# Patient Record
Sex: Female | Born: 1960 | Race: White | Hispanic: No | Smoking: Current every day smoker
Health system: Southern US, Community
[De-identification: ages and names within clinical notes are randomized; demographics above are authoritative.]

## PROBLEM LIST (undated history)

## (undated) HISTORY — PX: ECTOPIC PREGNANCY SURGERY: SHX613

---

## 2010-12-02 ENCOUNTER — Emergency Department (HOSPITAL_COMMUNITY)
Admission: EM | Admit: 2010-12-02 | Discharge: 2010-12-02 | Disposition: A | Discharge status: Home / Self Care | Payer: Self-pay | Attending: Emergency Medicine | Admitting: Emergency Medicine

## 2010-12-02 ENCOUNTER — Encounter: Payer: Self-pay | Admitting: Emergency Medicine

## 2010-12-02 DIAGNOSIS — K089 Disorder of teeth and supporting structures, unspecified: Secondary | ICD-10-CM | POA: Insufficient documentation

## 2010-12-02 DIAGNOSIS — K047 Periapical abscess without sinus: Secondary | ICD-10-CM | POA: Insufficient documentation

## 2010-12-02 MED ORDER — HYDROCODONE-ACETAMINOPHEN 5-325 MG PO TABS: 1.0000 | ORAL_TABLET | ORAL | Status: AC | PRN

## 2010-12-02 MED ORDER — AMOXICILLIN 500 MG PO CAPS: 500.0000 mg | ORAL_CAPSULE | Freq: Three times a day (TID) | ORAL | Status: AC

## 2010-12-02 NOTE — ED Notes (Signed)
Patient with no complaints at this time. Respirations even and unlabored. Skin warm/dry. Discharge instructions reviewed with patient at this time. Patient given opportunity to voice concerns/ask questions. Patient discharged at this time and left Emergency Department with steady gait.   

## 2010-12-02 NOTE — ED Notes (Signed)
Patient with c/o left lower jaw/dental pain for approximately one week. Swelling noted to left lower jaw. Patient reports fever at home.

## 2010-12-04 NOTE — ED Provider Notes (Signed)
History     CSN: [id number] Arrival date & time: 12/02/2010  3:34 PM   First MD Initiated Contact with Patient 12/02/10 1548      Chief Complaint  Patient presents with  . Dental Pain    (Consider location/radiation/quality/duration/timing/severity/associated sxs/prior treatment) Patient is a 50 y.o. female presenting with tooth pain. The history is provided by the patient.  Dental PainThe primary symptoms include mouth pain. Primary symptoms do not include headaches, fever, shortness of breath or sore throat. The symptoms began 5 to 7 days ago. The symptoms are worsening. The symptoms are new. The symptoms occur constantly.  Additional symptoms include: gum swelling, gum tenderness, jaw pain, facial swelling and ear pain. Additional symptoms do not include: trismus, trouble swallowing and taste disturbance.    Past Medical History  Diagnosis Date  . Ectopic pregnancy     Past Surgical History  Procedure Date  . Ectopic pregnancy surgery     No family history on file.  History  Substance Use Topics  . Smoking status: Current Everyday Smoker -- 0.5 packs/day    Types: Cigarettes  . Smokeless tobacco: Not on file  . Alcohol Use: No    OB History    Grav Para Term Preterm Abortions TAB SAB Ect Mult Living                  Review of Systems  Constitutional: Negative for fever.  HENT: Positive for ear pain, facial swelling and dental problem. Negative for congestion, sore throat, trouble swallowing and neck pain.   Eyes: Negative.   Respiratory: Negative for chest tightness and shortness of breath.   Cardiovascular: Negative for chest pain.  Gastrointestinal: Negative for nausea and abdominal pain.  Genitourinary: Negative.   Musculoskeletal: Negative for joint swelling and arthralgias.  Skin: Negative.  Negative for rash and wound.  Neurological: Negative for dizziness, weakness, light-headedness, numbness and headaches.  Hematological: Negative.     Psychiatric/Behavioral: Negative.     Allergies  Review of patient's allergies indicates no known allergies.  Home Medications   Current Outpatient Rx  Name Route Sig Dispense Refill  . IBUPROFEN 200 MG PO TABS Oral Take 400-600 mg by mouth 4 (four) times daily as needed. For pain     . AMOXICILLIN 500 MG PO CAPS Oral Take 1 capsule (500 mg total) by mouth 3 (three) times daily. 21 capsule 0  . HYDROCODONE-ACETAMINOPHEN 5-325 MG PO TABS Oral Take 1 tablet by mouth every 4 (four) hours as needed for pain. 15 tablet 0    BP 128/68  Pulse 96  Temp(Src) 99.4 F (37.4 C) (Oral)  Resp 18  Ht 5\' 6"  (1.676 m)  Wt 220 lb (99.791 kg)  BMI 35.51 kg/m2  SpO2 96%  Physical Exam  Constitutional: She is oriented to person, place, and time. She appears well-developed and well-nourished. No distress.  HENT:  Head: Normocephalic and atraumatic.  Right Ear: Tympanic membrane and external ear normal.  Left Ear: Tympanic membrane and external ear normal.  Mouth/Throat: Oropharynx is clear and moist and mucous membranes are normal. No oral lesions. Dental abscesses present.    Eyes: Conjunctivae are normal.  Neck: Normal range of motion. Neck supple.  Cardiovascular: Normal rate and normal heart sounds.   Pulmonary/Chest: Effort normal.  Abdominal: She exhibits no distension.  Musculoskeletal: Normal range of motion.  Lymphadenopathy:    She has no cervical adenopathy.  Neurological: She is alert and oriented to person, place, and time.  Skin: Skin is  warm and dry. No erythema.  Psychiatric: She has a normal mood and affect.    ED Course  Procedures (including critical care time)  Labs Reviewed - No data to display No results found.   1. Dental abscess       MDM  Amoxil,  Hydrocodone,  Dental referral list given.        Candis Musa, PA 12/04/10 [phone]

## 2010-12-04 NOTE — ED Provider Notes (Signed)
Medical screening examination/treatment/procedure(s) were performed by non-physician practitioner and as supervising physician I was immediately available for consultation/collaboration.   Laray Anger, DO 12/04/10 [phone]

## 2015-04-20 ENCOUNTER — Encounter (HOSPITAL_COMMUNITY): Payer: Self-pay | Admitting: Emergency Medicine

## 2015-04-20 ENCOUNTER — Emergency Department (HOSPITAL_COMMUNITY): Payer: Self-pay

## 2015-04-20 ENCOUNTER — Emergency Department (HOSPITAL_COMMUNITY)
Admission: EM | Admit: 2015-04-20 | Discharge: 2015-04-20 | Disposition: A | Discharge status: Home / Self Care | Payer: Self-pay | Attending: Emergency Medicine | Admitting: Emergency Medicine

## 2015-04-20 DIAGNOSIS — F17210 Nicotine dependence, cigarettes, uncomplicated: Secondary | ICD-10-CM | POA: Insufficient documentation

## 2015-04-20 DIAGNOSIS — W010XXA Fall on same level from slipping, tripping and stumbling without subsequent striking against object, initial encounter: Secondary | ICD-10-CM | POA: Insufficient documentation

## 2015-04-20 DIAGNOSIS — Z79899 Other long term (current) drug therapy: Secondary | ICD-10-CM | POA: Insufficient documentation

## 2015-04-20 DIAGNOSIS — M25562 Pain in left knee: Secondary | ICD-10-CM | POA: Insufficient documentation

## 2015-04-20 DIAGNOSIS — W19XXXA Unspecified fall, initial encounter: Secondary | ICD-10-CM

## 2015-04-20 MED ORDER — HYDROCODONE-ACETAMINOPHEN 5-325 MG PO TABS: ORAL_TABLET | ORAL | Status: DC

## 2015-04-20 MED ORDER — METHOCARBAMOL 500 MG PO TABS: 1000.0000 mg | ORAL_TABLET | Freq: Four times a day (QID) | ORAL | Status: DC | PRN

## 2015-04-20 NOTE — Discharge Instructions (Signed)
Take the prescriptions as directed.  Apply moist heat or ice to the area(s) of discomfort, for 15 minutes at a time, several times per day for the next few days.  Do not fall asleep on a heating or ice pack. Wear the knee immobilizer and use the crutches until you are seen in follow up.  Call the Orthopedic doctor today to schedule a follow up appointment this week.  Return to the Emergency Department immediately if worsening.

## 2015-04-20 NOTE — ED Notes (Signed)
PT states she was walking outside and slipped on some water and landed on her left knee and having left groin pain since incident.

## 2015-04-20 NOTE — ED Provider Notes (Signed)
CSN: [id number]     Arrival date & time 04/20/15  9562 History   First MD Initiated Contact with Patient 04/20/15 0957     Chief Complaint  Patient presents with  . Knee Injury      HPI  Pt was seen at 1005. Per pt, c/o sudden onset and resolution of one episode of slip and fall that occurred yesterday. Pt states she slipped in water and "did a split with my legs" landing on her left knee. Pain continues today. Denies focal motor weakness, no tingling/numbness in extremities, no fevers, no rash, no back pain, no abd pain.   Past Medical History  Diagnosis Date  . Ectopic pregnancy    Past Surgical History  Procedure Laterality Date  . Ectopic pregnancy surgery      Social History  Substance Use Topics  . Smoking status: Current Every Day Smoker -- 0.50 packs/day    Types: Cigarettes  . Smokeless tobacco: None  . Alcohol Use: No   OB History    Gravida Para Term Preterm AB TAB SAB Ectopic Multiple Living   Review of Systems ROS: Statement: All systems negative except as marked or noted in the HPI; Constitutional: Negative for fever and chills. ; ; Eyes: Negative for eye pain, redness and discharge. ; ; ENMT: Negative for ear pain, hoarseness, nasal congestion, sinus pressure and sore throat. ; ; Cardiovascular: Negative for chest pain, palpitations, diaphoresis, dyspnea and peripheral edema. ; ; Respiratory: Negative for cough, wheezing and stridor. ; ; Gastrointestinal: Negative for nausea, vomiting, diarrhea, abdominal pain, blood in stool, hematemesis, jaundice and rectal bleeding. . ; ; Genitourinary: Negative for dysuria, flank pain and hematuria. ; ; Musculoskeletal: +knee pain, hip pain. Negative for back pain and neck pain. Negative for swelling and deformity.; ; Skin: Negative for pruritus, rash, abrasions, blisters, bruising and skin lesion.; ; Neuro: Negative for headache, lightheadedness and neck stiffness. Negative for weakness, altered level of  consciousness , altered mental status, extremity weakness, paresthesias, involuntary movement, seizure and syncope.      Allergies  Review of patient's allergies indicates no known allergies.  Home Medications   Prior to Admission medications   Medication Sig Start Date End Date Taking? Authorizing Provider  ibuprofen (ADVIL,MOTRIN) 200 MG tablet Take 400-600 mg by mouth 4 (four) times daily as needed. For pain     Historical Provider, MD   BP 117/67 mmHg  Pulse 76  Temp(Src) 98.1 F (36.7 C) (Oral)  Resp 16  Ht  (1.676 m)  Wt 210 lb (95.255 kg)  BMI 33.91 kg/m2  SpO2 99% Physical Exam 1010: Physical examination:  Nursing notes reviewed; Vital signs and O2 SAT reviewed;  Constitutional: Well developed, Well nourished, Well hydrated, In no acute distress; Head:  Normocephalic, atraumatic; Eyes: EOMI, PERRL, No scleral icterus; ENMT: Mouth and pharynx normal, Mucous membranes moist; Neck: Supple, Full range of motion, No lymphadenopathy; Cardiovascular: Regular rate and rhythm, No murmur, rub, or gallop; Respiratory: Breath sounds clear & equal bilaterally, No rales, rhonchi, wheezes.  Speaking full sentences with ease, Normal respiratory effort/excursion; Chest: Nontender, Movement normal; Abdomen: Soft, Nontender, Nondistended, Normal bowel sounds; Genitourinary: No CVA tenderness; Spine:  No midline CS, TS, LS tenderness.;; Extremities: Pulses normal, No deformity. NT left hip/ankle/foot. +FROM left knee, including able to lift extended LLE off stretcher, and extend left lower leg against resistance.  No ligamentous laxity.  No patellar or quad  tendon step-offs.  NMS intact left foot, strong pedal pp. +plantarflexion of left foot w/calf squeeze.  No palpable gap left Achilles's tendon.  No proximal fibular head tenderness.  No edema, erythema, warmth, ecchymosis or deformity. +TTP left medial knee.  No edema, No calf tenderness, edema or asymmetry.; Neuro: AA&Ox3, Major CN grossly  intact.  Speech clear. No gross focal motor or sensory deficits in extremities.; Skin: Color normal, Warm, Dry.   ED Course  Procedures (including critical care time) Labs Review  Imaging Review  I have personally reviewed and evaluated these images and lab results as part of my medical decision-making.   EKG Interpretation None      MDM  MDM Reviewed: previous chart, nursing note and vitals Interpretation: x-ray     Dg Knee Complete 4 Views Left 04/20/2015  CLINICAL DATA:  55 year old female slipped and fell yesterday. Left knee pain extending up left leg to buttock region. Initial encounter. EXAM: LEFT KNEE - COMPLETE 4+ VIEW COMPARISON:  None. FINDINGS: Examination was performed during PACs downtime. No comparison exams. Tricompartment degenerative changes most notable medial tibial femoral joint space and patellofemoral joint space. Slight irregularity lateral femoral condyle without fracture or dislocation noted. Tiny suprapatellar joint effusion. 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 Tiny suprapatellar joint effusion. Electronically Signed   By: Lacy DuverneySteven  Olson M.D.   On: 04/20/2015 11:06   Dg Hip Unilat With Pelvis 2-3 Views Left 04/20/2015  CLINICAL DATA:  Acute left leg pain after fall yesterday. Initial encounter. EXAM: DG HIP (WITH OR WITHOUT PELVIS) 2-3V LEFT COMPARISON:  None. FINDINGS: There is no evidence of hip fracture or dislocation. There is no evidence of arthropathy or other focal bone abnormality. IMPRESSION: Normal left hip. Electronically Signed   By: Lupita RaiderJames  Green Jr, M.D.   On: 04/20/2015 11:07    1115:  Tx symptomatically, f/u Ortho MD.  Dx and testing d/w pt.  Questions answered.  Verb understanding, agreeable to d/c home with outpt f/u.   Samuel JesterKathleen Bronc Brosseau, DO 04/23/15 [phone]

## 2015-04-20 NOTE — ED Notes (Signed)
Slipped in water at a friends house yesterday, rates pain 6/10.  Pain is to left leg, says pain starts at left knee and goes up left leg to buttock.

## 2015-04-20 NOTE — ED Notes (Signed)
Assumed care of patient from Daphne, RN.  

## 2016-05-12 ENCOUNTER — Emergency Department (HOSPITAL_COMMUNITY): Payer: Self-pay

## 2016-05-12 ENCOUNTER — Encounter (HOSPITAL_COMMUNITY): Payer: Self-pay | Admitting: *Deleted

## 2016-05-12 ENCOUNTER — Emergency Department (HOSPITAL_COMMUNITY)
Admission: EM | Admit: 2016-05-12 | Discharge: 2016-05-12 | Disposition: A | Discharge status: Home / Self Care | Payer: Self-pay | Attending: Emergency Medicine | Admitting: Emergency Medicine

## 2016-05-12 DIAGNOSIS — Y999 Unspecified external cause status: Secondary | ICD-10-CM | POA: Insufficient documentation

## 2016-05-12 DIAGNOSIS — F17210 Nicotine dependence, cigarettes, uncomplicated: Secondary | ICD-10-CM | POA: Insufficient documentation

## 2016-05-12 DIAGNOSIS — S86919A Strain of unspecified muscle(s) and tendon(s) at lower leg level, unspecified leg, initial encounter: Secondary | ICD-10-CM

## 2016-05-12 DIAGNOSIS — Y939 Activity, unspecified: Secondary | ICD-10-CM | POA: Insufficient documentation

## 2016-05-12 DIAGNOSIS — S86911A Strain of unspecified muscle(s) and tendon(s) at lower leg level, right leg, initial encounter: Secondary | ICD-10-CM | POA: Insufficient documentation

## 2016-05-12 DIAGNOSIS — M79604 Pain in right leg: Secondary | ICD-10-CM

## 2016-05-12 DIAGNOSIS — X58XXXA Exposure to other specified factors, initial encounter: Secondary | ICD-10-CM | POA: Insufficient documentation

## 2016-05-12 DIAGNOSIS — Y929 Unspecified place or not applicable: Secondary | ICD-10-CM | POA: Insufficient documentation

## 2016-05-12 MED ORDER — CYCLOBENZAPRINE HCL 10 MG PO TABS: 10.0000 mg | ORAL_TABLET | Freq: Three times a day (TID) | ORAL | 0 refills | Status: DC

## 2016-05-12 MED ORDER — IBUPROFEN 600 MG PO TABS: 600.0000 mg | ORAL_TABLET | Freq: Four times a day (QID) | ORAL | 0 refills | Status: DC

## 2016-05-12 NOTE — ED Triage Notes (Signed)
Pt comes in with right leg pain starting on Tuesday. Pt states she woke up with this pain, denies injury. Pain is located in the knee and posterior knee, moving down her leg into her foot.

## 2016-05-12 NOTE — Discharge Instructions (Signed)
Your ultrasound is negative for Baker's cyst, and it is negative for any blood clots. I suspect that you have a strain of one of the tendons in the back of your leg. Please use the knee immobilizer over the next 5-7 days. Use Flexeril 3 times daily, use ibuprofen with breakfast, lunch, dinner, and at bedtime. Please see Dr. Romeo AppleHarrison for orthopedic evaluation of this issue if not improving.

## 2016-05-12 NOTE — ED Provider Notes (Signed)
[facility]EMER[facility]ENCY DEPT Provider Note   CSN: [id number] Arrival date & time: 05/12/16  0830     History   Chief Complaint Chief Complaint  Patient presents with  . Leg Pain    HPI Maureen Strong is a 56 y.o. female.   Leg Pain   This is a new problem. The current episode started more than 2 days ago. The problem occurs constantly. The problem has been gradually worsening. The pain is present in the right knee, right upper leg and right lower leg. The pain is moderate. Associated symptoms include limited range of motion. The symptoms are aggravated by standing. She has tried OTC pain medications for the symptoms. The treatment provided no relief. There has been no history of extremity trauma.    Past Medical History:  Diagnosis Date  . Ectopic pregnancy     There are no active problems to display for this patient.   Past Surgical History:  Procedure Laterality Date  . ECTOPIC PREGNANCY SURGERY      OB History    Gravida Para Term Preterm AB Living   4       1 3    SAB TAB Ectopic Multiple Live Births       1           Home Medications    Prior to Admission medications   Medication Sig Start Date End Date Taking? Authorizing Provider  HYDROcodone-acetaminophen (NORCO/VICODIN) 5-325 MG tablet 1 or 2 tabs PO q6 hours prn pain 04/20/15   Samuel JesterKathleen McManus, DO  ibuprofen (ADVIL,MOTRIN) 200 MG tablet Take 400-600 mg by mouth 4 (four) times daily as needed. For pain     Historical Provider, MD  methocarbamol (ROBAXIN) 500 MG tablet Take 2 tablets (1,000 mg total) by mouth 4 (four) times daily as needed for muscle spasms (muscle spasm/pain). 04/20/15   Samuel JesterKathleen McManus, DO    Family History No family history on file.  Social History Social History  Substance Use Topics  . Smoking status: Current Every Day Smoker    Packs/day: 0.50    Types: Cigarettes  . Smokeless tobacco: Never Used  . Alcohol use Yes     Comment: occ.     Allergies   Patient has no known  allergies.   Review of Systems Review of Systems  Constitutional: Negative for activity change.       All ROS Neg except as noted in HPI  HENT: Negative for nosebleeds.   Eyes: Negative for photophobia and discharge.  Respiratory: Negative for cough, shortness of breath and wheezing.   Cardiovascular: Negative for chest pain and palpitations.  Gastrointestinal: Negative for abdominal pain and blood in stool.  Genitourinary: Negative for dysuria, frequency and hematuria.  Musculoskeletal: Negative for arthralgias, back pain and neck pain.       Leg pain  Skin: Negative.   Neurological: Negative for dizziness, seizures and speech difficulty.  Psychiatric/Behavioral: Negative for confusion and hallucinations.     Physical Exam Updated Vital Signs BP (!) 137/99 (BP Location: Left Arm)   Pulse 80   Temp 98.9 F (37.2 C) (Oral)   Resp 18   Ht 5\' 6"  (1.676 m)   Wt 102.1 kg   SpO2 100%   BMI 36.32 kg/m   Physical Exam  Constitutional: She is oriented to person, place, and time. She appears well-developed and well-nourished.  Non-toxic appearance.  HENT:  Head: Normocephalic.  Right Ear: Tympanic membrane and external ear normal.  Left Ear: Tympanic membrane and external ear  normal.  Eyes: EOM and lids are normal. Pupils are equal, round, and reactive to light.  Neck: Normal range of motion. Neck supple. Carotid bruit is not present.  Cardiovascular: Normal rate, regular rhythm, normal heart sounds, intact distal pulses and normal pulses.   Pulmonary/Chest: No respiratory distress. She has rhonchi.  Few scattered wheezes.  Abdominal: Soft. Bowel sounds are normal. There is no tenderness. There is no guarding.  Musculoskeletal:       Right upper leg: She exhibits tenderness.       Legs: Lymphadenopathy:       Head (right side): No submandibular adenopathy present.       Head (left side): No submandibular adenopathy present.    She has no cervical adenopathy.  Neurological:  She is alert and oriented to person, place, and time. She has normal strength. No cranial nerve deficit or sensory deficit.  Skin: Skin is warm and dry.  Psychiatric: She has a normal mood and affect. Her speech is normal.  Nursing note and vitals reviewed.    ED Treatments / Results  Labs (all labs ordered are listed, but only abnormal results are displayed) Labs Reviewed - No data to display  EKG  EKG Interpretation None       Radiology No results found.  Procedures Procedures (including critical care time)  Medications Ordered in ED Medications - No data to display   Initial Impression / Assessment and Plan / ED Course  I have reviewed the triage vital signs and the nursing notes.  Pertinent labs & imaging results that were available during my care of the patient were reviewed by me and considered in my medical decision making (see chart for details).       Final Clinical Impressions(s) / ED Diagnoses MDM Vital signs within normal limits. Pulse oximetry is 96% on room air. Within normal limits by my interpretation.  Patient states she does a lot of standing. She works in [employer]. She's been having increasing right leg pain behind the knee, it goes up the leg and down the leg to the area near the foot. She has noted minimal swelling present.  There no gross neurologic or vascular deficits appreciated. Ultrasound is negative for deep vein thrombosis or Baker's cyst.  I suspect the patient has a strain/sprain of the muscles in the leg, and there could possibly be some lumbar back related issue to this pain. I've asked patient to rest her leg is much as possible, and to see her primary physician, or physicians at the [facility] for additional evaluation and management.    Final diagnoses:  Lower extremity tendon strain, initial encounter  Right leg pain    New Prescriptions Discharge Medication List as of 05/12/2016 10:24 AM    START taking these medications    Details  cyclobenzaprine (FLEXERIL) 10 MG tablet Take 1 tablet (10 mg total) by mouth 3 (three) times daily., Starting Fri 05/12/2016, Print         Ivery Quale, PA-C 05/13/16 1055    Derwood Kaplan, MD 05/14/16 [phone]

## 2016-12-21 IMAGING — DX DG KNEE COMPLETE 4+V*L*
4 series · 4 of 4 positions shown · non-contrast
Comparison: None.

CLINICAL DATA: 55-year-old female slipped and fell yesterday. Left
knee pain extending up left leg to buttock region. Initial
encounter.

EXAM:
LEFT KNEE - COMPLETE 4+ VIEW

[knee ap]
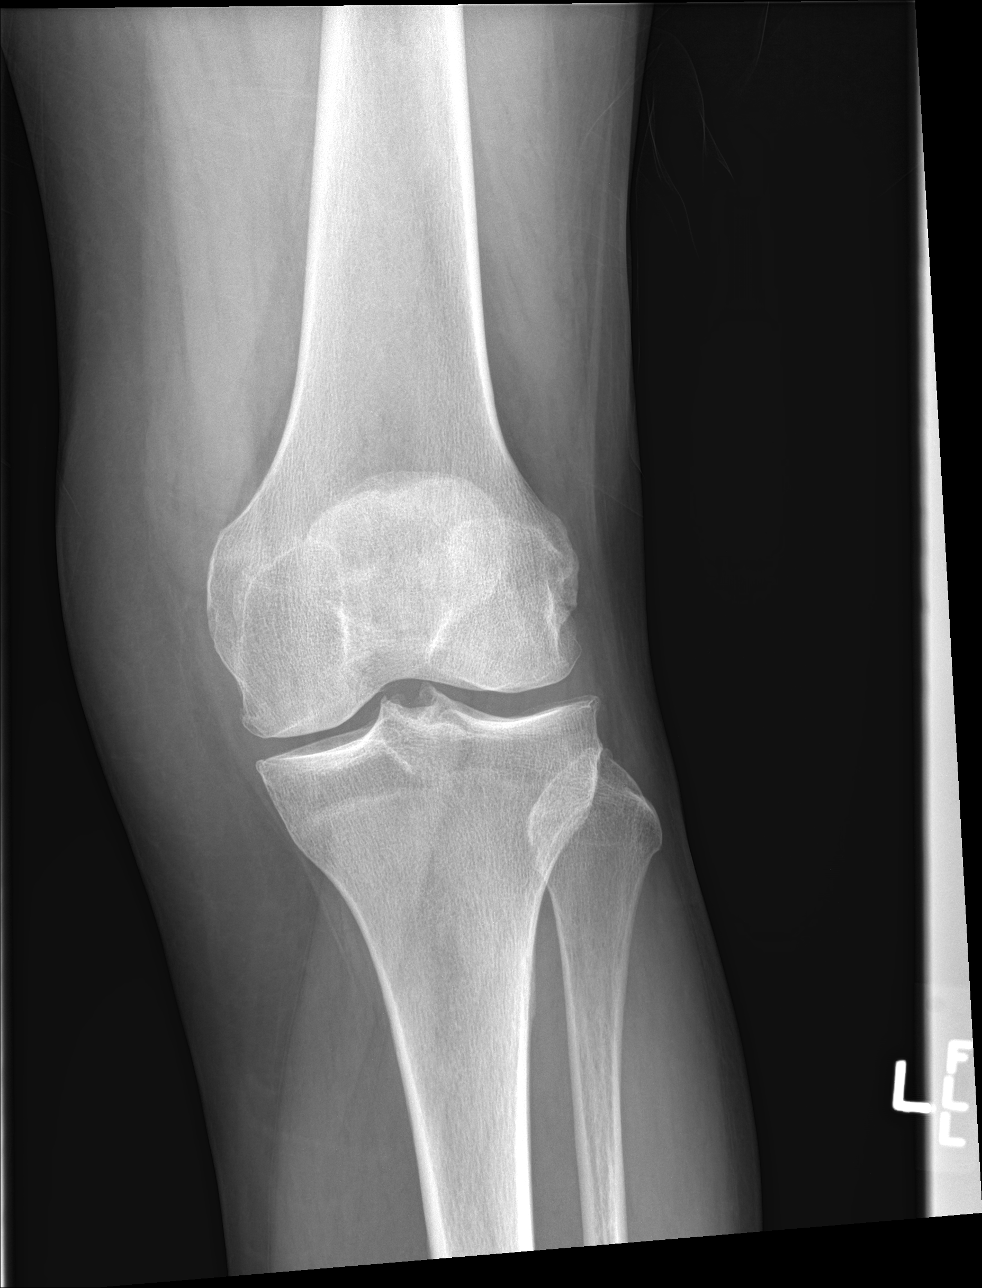

[knee obl (1 of 2)]
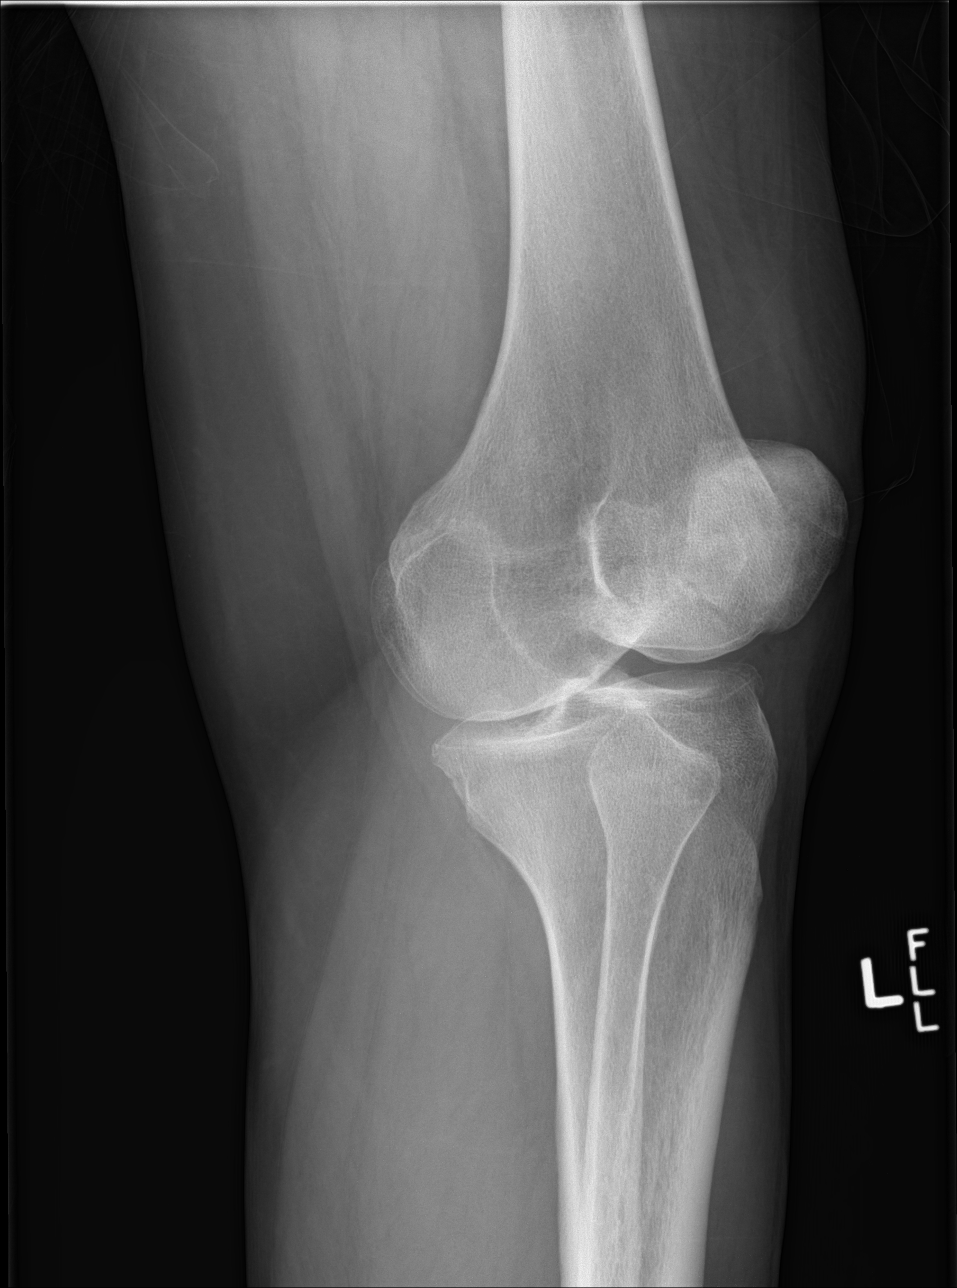

[knee obl (2 of 2)]
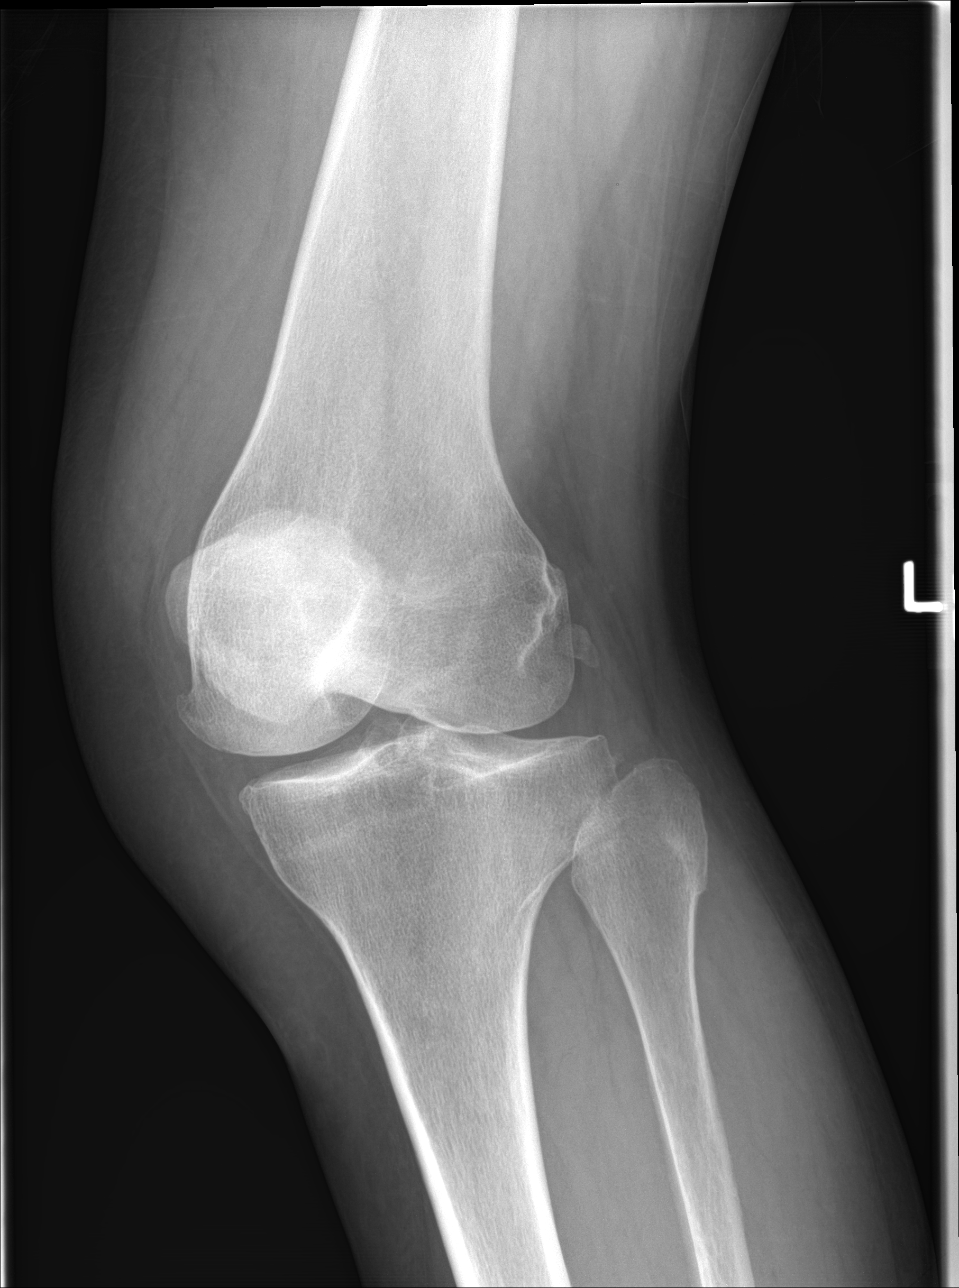

[knee lat]
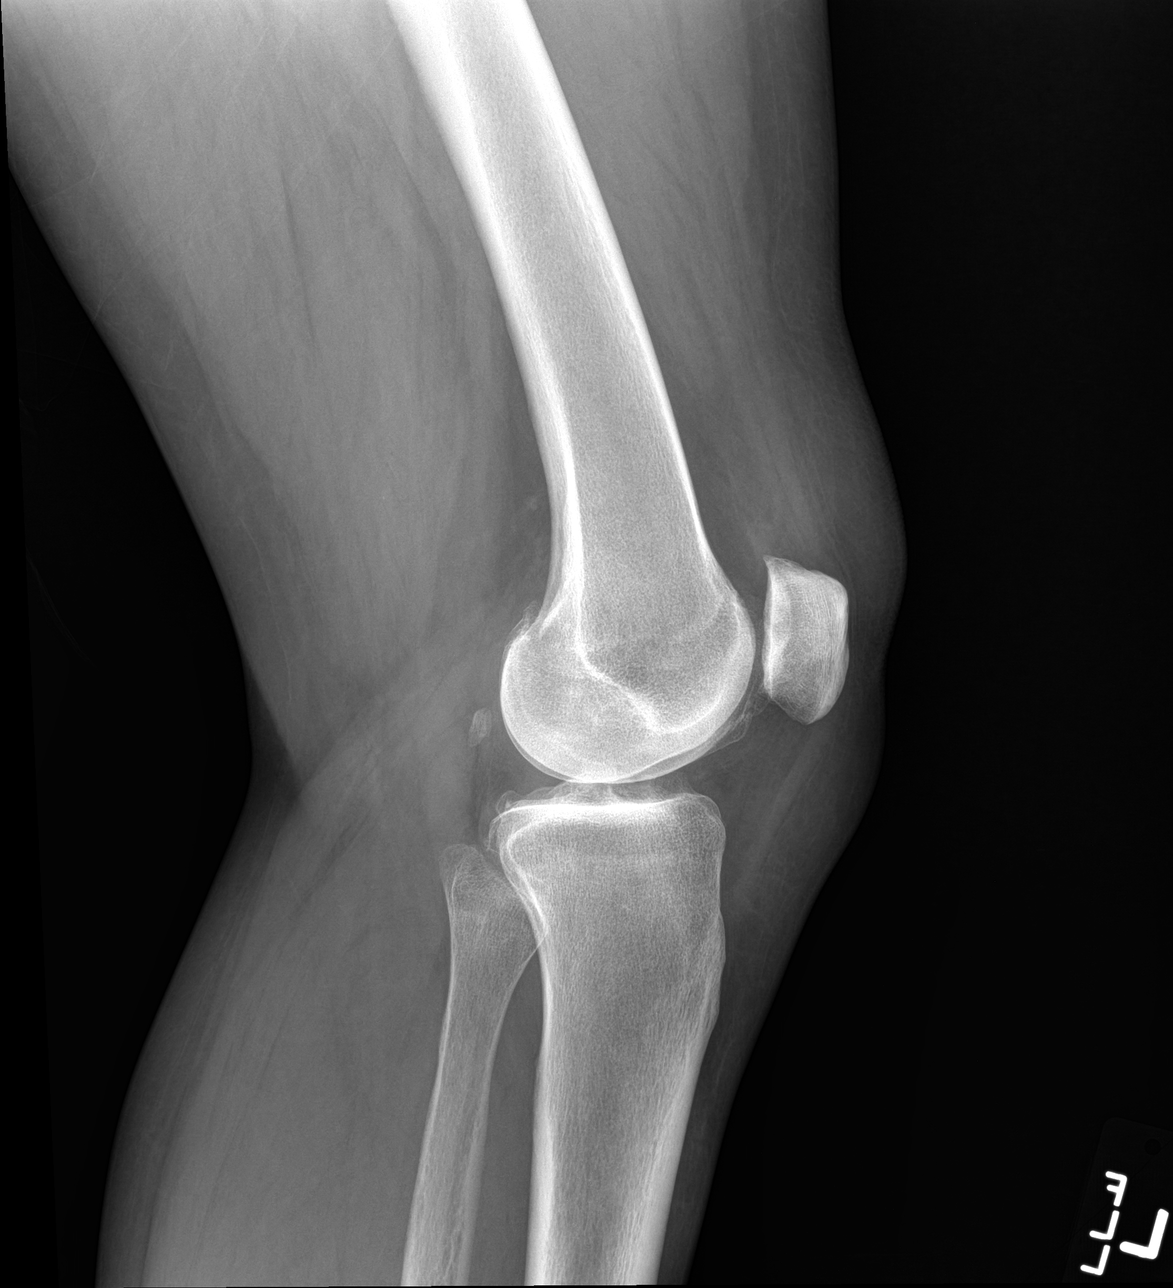

[4 of 4 positions shown; findings below may reference images not displayed]

FINDINGS: Examination was performed during PACs downtime. No comparison exams.

Tricompartment degenerative changes most notable medial tibial
femoral joint space and patellofemoral joint space. Slight
irregularity lateral femoral condyle without fracture or dislocation
noted.

Tiny suprapatellar joint effusion.
IMPRESSION: Tricompartment degenerative changes without fracture or dislocation
noted.

Tiny suprapatellar joint effusion.

## 2019-11-19 ENCOUNTER — Ambulatory Visit (INDEPENDENT_AMBULATORY_CARE_PROVIDER_SITE_OTHER): Payer: Self-pay | Admitting: Nurse Practitioner

## 2023-07-11 ENCOUNTER — Ambulatory Visit: Payer: Self-pay

## 2023-07-11 ENCOUNTER — Ambulatory Visit: Admission: EM | Admit: 2023-07-11 | Discharge: 2023-07-11 | Disposition: A | Discharge status: Home / Self Care

## 2023-07-11 DIAGNOSIS — R519 Headache, unspecified: Secondary | ICD-10-CM

## 2023-07-11 DIAGNOSIS — H9202 Otalgia, left ear: Secondary | ICD-10-CM | POA: Diagnosis not present

## 2023-07-11 NOTE — ED Provider Notes (Signed)
 [facility]    CSN: [id number] Arrival date & time: 07/11/23  1044      History   Chief Complaint No chief complaint on file.   HPI Maureen Strong is a 63 y.o. female.   Patient presenting today with 1 day history of left-sided ear pain, tenderness and slight swelling to the facial region just in front of the left ear Since Waking up This Morning.  Denies bleeding or drainage from the ear, decreased hearing, headache, fever, dental issues, sore throat, fever, chills.  So far not trying anything over-the-counter for symptoms.    Past Medical History:  Diagnosis Date   Ectopic pregnancy     There are no active problems to display for this patient.   Past Surgical History:  Procedure Laterality Date   ECTOPIC PREGNANCY SURGERY      OB History     Gravida  4   Para      Term      Preterm      AB  1   Living  3      SAB      IAB      Ectopic  1   Multiple      Live Births               Home Medications    Prior to Admission medications   Medication Sig Start Date End Date Taking? Authorizing Provider  cyclobenzaprine  (FLEXERIL ) 10 MG tablet Take 1 tablet (10 mg total) by mouth 3 (three) times daily. 05/12/16   Armida Culver, PA-C  HYDROcodone -acetaminophen  (NORCO/VICODIN) 5-325 MG tablet 1 or 2 tabs PO q6 hours prn pain 04/20/15   Joyice Sauer, DO  ibuprofen  (ADVIL ,MOTRIN ) 600 MG tablet Take 1 tablet (600 mg total) by mouth 4 (four) times daily. 05/12/16   Armida Culver, PA-C    Family History History reviewed. No pertinent family history.  Social History Social History   Tobacco Use   Smoking status: Every Day    Current packs/day: 0.50    Types: Cigarettes   Smokeless tobacco: Never  Substance Use Topics   Alcohol use: Yes    Comment: occ.   Drug use: No     Allergies   Patient has no known allergies.   Review of Systems Review of Systems Per HPI  Physical Exam Triage Vital Signs ED Triage Vitals   Encounter Vitals Group     BP 07/11/23 1052 (!) 162/85     Girls Systolic BP Percentile --      Girls Diastolic BP Percentile --      Boys Systolic BP Percentile --      Boys Diastolic BP Percentile --      Pulse Rate 07/11/23 1052 99     Resp 07/11/23 1052 20     Temp 07/11/23 1052 98.7 F (37.1 C)     Temp Source 07/11/23 1052 Oral     SpO2 07/11/23 1052 92 %     Weight --      Height --      Head Circumference --      Peak Flow --      Pain Score 07/11/23 1054 5     Pain Loc --      Pain Education --      Exclude from Growth Chart --    No data found.  Updated Vital Signs BP (!) 162/85 (BP Location: Right Arm)   Pulse 99   Temp 98.7 F (37.1  C) (Oral)   Resp 20   SpO2 92%   Visual Acuity Right Eye Distance:   Left Eye Distance:   Bilateral Distance:    Right Eye Near:   Left Eye Near:    Bilateral Near:     Physical Exam Vitals and nursing note reviewed.  Constitutional:      Appearance: Normal appearance. She is not ill-appearing.  HENT:     Head: Atraumatic.     Comments: Possibly some very slightly inflamed preauricular lymph nodes on the left    Right Ear: Tympanic membrane, ear canal and external ear normal.     Left Ear: Tympanic membrane, ear canal and external ear normal.     Mouth/Throat:     Mouth: Mucous membranes are moist.     Pharynx: Oropharynx is clear. No posterior oropharyngeal erythema.  Eyes:     Extraocular Movements: Extraocular movements intact.     Conjunctiva/sclera: Conjunctivae normal.  Cardiovascular:     Rate and Rhythm: Normal rate.  Pulmonary:     Effort: Pulmonary effort is normal.  Musculoskeletal:        General: Normal range of motion.     Cervical back: Normal range of motion and neck supple.  Skin:    General: Skin is warm and dry.  Neurological:     Mental Status: She is alert and oriented to person, place, and time.  Psychiatric:        Mood and Affect: Mood normal.        Thought Content: Thought content  normal.        Judgment: Judgment normal.      UC Treatments / Results  Labs (all labs ordered are listed, but only abnormal results are displayed) Labs Reviewed - No data to display  EKG   Radiology No results found.  Procedures Procedures (including critical care time)  Medications Ordered in UC Medications - No data to display  Initial Impression / Assessment and Plan / UC Course  I have reviewed the triage vital signs and the nursing notes.  Pertinent labs & imaging results that were available during my care of the patient were reviewed by me and considered in my medical decision making (see chart for details).     Hypertensive in triage, otherwise vital signs within normal limits.  Exam very reassuring with no obvious abnormalities.  Possibly some slight lymphadenopathy in the left irregular region, however no obvious bacterial cause appreciated on exam.  Possibly allergic versus viral.  Treat with warm compresses, over-the-counter pain relievers and follow-up for worsening symptoms.  Final Clinical Impressions(s) / UC Diagnoses   Final diagnoses:  Left facial pain  Left ear pain     Discharge Instructions      Your exam is reassuring today, I do not see any evidence of a bacterial infection and suspect some lymph node inflammation in the area causing the pain.  This can be viral, allergic or other inflammatory cause in addition to bacterial infections so in the absence of an obvious bacterial infection we will treat supportively with warm compresses, ibuprofen  for pain and inflammation and follow-up for worsening or unresolving symptoms    ED Prescriptions   None    PDMP not reviewed this encounter.   Stuart Vernell Norris, [place] 07/11/23 1324

## 2023-07-11 NOTE — Discharge Instructions (Signed)
 Your exam is reassuring today, I do not see any evidence of a bacterial infection and suspect some lymph node inflammation in the area causing the pain.  This can be viral, allergic or other inflammatory cause in addition to bacterial infections so in the absence of an obvious bacterial infection we will treat supportively with warm compresses, ibuprofen  for pain and inflammation and follow-up for worsening or unresolving symptoms

## 2023-07-11 NOTE — ED Triage Notes (Signed)
 Pt reports  ear pain, with facial swelling and discomfort x 1 day

## 2023-08-30 ENCOUNTER — Ambulatory Visit (INDEPENDENT_AMBULATORY_CARE_PROVIDER_SITE_OTHER): Admitting: Nurse Practitioner

## 2023-08-30 ENCOUNTER — Encounter: Payer: Self-pay | Admitting: Nurse Practitioner

## 2023-08-30 VITALS — BP 138/78 | HR 101 | Ht 66.0 in | Wt 214.0 lb

## 2023-08-30 DIAGNOSIS — R7303 Prediabetes: Secondary | ICD-10-CM

## 2023-08-30 DIAGNOSIS — F17209 Nicotine dependence, unspecified, with unspecified nicotine-induced disorders: Secondary | ICD-10-CM | POA: Diagnosis not present

## 2023-08-30 DIAGNOSIS — R5383 Other fatigue: Secondary | ICD-10-CM

## 2023-08-30 DIAGNOSIS — R590 Localized enlarged lymph nodes: Secondary | ICD-10-CM

## 2023-08-30 DIAGNOSIS — E663 Overweight: Secondary | ICD-10-CM | POA: Diagnosis not present

## 2023-08-30 NOTE — Patient Instructions (Signed)
 1) CT mandibular lymphadenopathy - left sided mandibular mass 2) Pt will send MyChart message when she decides on choice for stopping smoking 3) Return for fasting labs in the next 2 weeks 4) Follow up appt in 3 months

## 2023-08-30 NOTE — Progress Notes (Signed)
 New Patient Office Visit  Subjective    Patient ID: Maureen Strong, female    DOB: 1960-01-25  Age: 63 y.o. MRN: [id number]  CC:  Chief Complaint  Patient presents with   Establish Care    HPI Maureen Strong presents to establish care Patient here today to establish primary care.  Needs to catch up on health maintenance screens, patient agrees to have screening mammo scheduled and will let this provider know when she wants to schedule screening colonoscopy and if she needs a referral for her bilateral eye exam.  Patient takes no meds.  Patient will return for fasting labs.    Outpatient Encounter Medications as of 08/30/2023  Medication Sig   cyclobenzaprine  (FLEXERIL ) 10 MG tablet Take 1 tablet (10 mg total) by mouth 3 (three) times daily.   HYDROcodone -acetaminophen  (NORCO/VICODIN) 5-325 MG tablet 1 or 2 tabs PO q6 hours prn pain (Patient not taking: Reported on 08/30/2023)   ibuprofen  (ADVIL ,MOTRIN ) 600 MG tablet Take 1 tablet (600 mg total) by mouth 4 (four) times daily.   No facility-administered encounter medications on file as of 08/30/2023.    Past Medical History:  Diagnosis Date   Ectopic pregnancy     Past Surgical History:  Procedure Laterality Date   ECTOPIC PREGNANCY SURGERY      No family history on file.  Social History   Socioeconomic History   Marital status: Widowed    Spouse name: Not on file   Number of children: Not on file   Years of education: Not on file   Highest education level: GED or equivalent  Occupational History   Not on file  Tobacco Use   Smoking status: Every Day    Current packs/day: 0.50    Types: Cigarettes   Smokeless tobacco: Never  Substance and Sexual Activity   Alcohol use: Yes    Comment: occ.   Drug use: No   Sexual activity: Not on file  Other Topics Concern   Not on file  Social History Narrative   Not on file   Social Drivers of Health   Financial Resource Strain: Medium Risk (08/29/2023)    Overall Financial Resource Strain (CARDIA)    Difficulty of Paying Living Expenses: Somewhat hard  Food Insecurity: Food Insecurity Present (08/29/2023)   Hunger Vital Sign    Worried About Running Out of Food in the Last Year: Sometimes true    Ran Out of Food in the Last Year: Sometimes true  Transportation Needs: No Transportation Needs (08/29/2023)   PRAPARE - Administrator, Civil Service (Medical): No    Lack of Transportation (Non-Medical): No  Physical Activity: Insufficiently Active (08/29/2023)   Exercise Vital Sign    Days of Exercise per Week: 2 days    Minutes of Exercise per Session: 20 min  Stress: No Stress Concern Present (08/29/2023)   [employer] of Occupational Health - Occupational Stress Questionnaire    Feeling of Stress: Only a little  Social Connections: Socially Isolated (08/29/2023)   Social Connection and Isolation Panel    Frequency of Communication with Friends and Family: Once a week    Frequency of Social Gatherings with Friends and Family: Once a week    Attends Religious Services: 1 to 4 times per year    Active Member of [employer] or Organizations: No    Attends Banker Meetings: Not on file    Marital Status: Widowed  Intimate Partner Violence: Not on file  ROS      Objective    BP (!) 148/81   Pulse (!) 101   Ht 5' 6 (1.676 m)   Wt 214 lb (97.1 kg)   SpO2 96%   BMI 34.54 kg/m   Physical Exam Vitals and nursing note reviewed.  Constitutional:      Appearance: Normal appearance.  HENT:     Head: Normocephalic.     Nose: Nose normal.     Mouth/Throat:     Mouth: Mucous membranes are moist.  Cardiovascular:     Rate and Rhythm: Normal rate and regular rhythm.     Pulses: Normal pulses.     Heart sounds: Normal heart sounds.  Pulmonary:     Effort: Pulmonary effort is normal.     Breath sounds: Normal breath sounds.  Musculoskeletal:        General: Normal range of motion.  Lymphadenopathy:      Cervical: Cervical adenopathy present.  Skin:    General: Skin is warm and dry.  Neurological:     Mental Status: She is alert and oriented to person, place, and time.  Psychiatric:        Mood and Affect: Mood normal.        Behavior: Behavior normal.         Assessment & Plan:  1) CT mandibular lymphadenopathy - left sided mandibular mass 2) Pt will send MyChart message when she decides on choice for stopping smoking 3) Return for fasting labs in the next 2 weeks 4) Follow up appt in 3 months   Problem List Items Addressed This Visit   None   No follow-ups on file.   Neale Carpen, NP

## 2023-09-05 LAB — CMP14+EGFR
ALT: 42 IU/L — ABNORMAL HIGH (ref 0–32)
AST: 50 IU/L — ABNORMAL HIGH (ref 0–40)
Albumin: 3.9 g/dL (ref 3.9–4.9)
Alkaline Phosphatase: 115 IU/L (ref 44–121)
BUN/Creatinine Ratio: 7 — ABNORMAL LOW (ref 12–28)
BUN: 6 mg/dL — ABNORMAL LOW (ref 8–27)
Bilirubin Total: 1.3 mg/dL — ABNORMAL HIGH (ref 0.0–1.2)
CO2: 25 mmol/L (ref 20–29)
Calcium: 9.4 mg/dL (ref 8.7–10.3)
Chloride: 101 mmol/L (ref 96–106)
Creatinine, Ser: 0.83 mg/dL (ref 0.57–1.00)
Globulin, Total: 3 g/dL (ref 1.5–4.5)
Glucose: 221 mg/dL — ABNORMAL HIGH (ref 70–99)
Potassium: 4.8 mmol/L (ref 3.5–5.2)
Sodium: 139 mmol/L (ref 134–144)
Total Protein: 6.9 g/dL (ref 6.0–8.5)
eGFR: 78 mL/min/1.73 (ref >59)

## 2023-09-05 LAB — CBC WITH DIFFERENTIAL/PLATELET
Basophils Absolute: 0 x10E3/uL (ref 0.0–0.2)
Basos: 1 %
EOS (ABSOLUTE): 0.1 x10E3/uL (ref 0.0–0.4)
Eos: 2 %
Hematocrit: 50.6 % — ABNORMAL HIGH (ref 34.0–46.6)
Hemoglobin: 16.9 g/dL — ABNORMAL HIGH (ref 11.1–15.9)
Immature Grans (Abs): 0 x10E3/uL (ref 0.0–0.1)
Immature Granulocytes: 0 %
Lymphocytes Absolute: 1.7 x10E3/uL (ref 0.7–3.1)
Lymphs: 32 %
MCH: 33.1 pg — ABNORMAL HIGH (ref 26.6–33.0)
MCHC: 33.4 g/dL (ref 31.5–35.7)
MCV: 99 fL — ABNORMAL HIGH (ref 79–97)
Monocytes Absolute: 0.6 x10E3/uL (ref 0.1–0.9)
Monocytes: 11 %
Neutrophils Absolute: 2.8 x10E3/uL (ref 1.4–7.0)
Neutrophils: 54 %
Platelets: 141 x10E3/uL — ABNORMAL LOW (ref 150–450)
RBC: 5.11 x10E6/uL (ref 3.77–5.28)
RDW: 13.2 % (ref 11.7–15.4)
WBC: 5.2 x10E3/uL (ref 3.4–10.8)

## 2023-09-05 LAB — LIPID PANEL
Chol/HDL Ratio: 6.4 ratio — ABNORMAL HIGH (ref 0.0–4.4)
Cholesterol, Total: 160 mg/dL (ref 100–199)
HDL: 25 mg/dL — ABNORMAL LOW
LDL Chol Calc (NIH): 101 mg/dL — ABNORMAL HIGH (ref 0–99)
Triglycerides: 191 mg/dL — ABNORMAL HIGH (ref 0–149)
VLDL Cholesterol Cal: 34 mg/dL (ref 5–40)

## 2023-09-05 LAB — TSH+FREE T4
Free T4: 0.96 ng/dL (ref 0.82–1.77)
TSH: 2.39 u[IU]/mL (ref 0.450–4.500)

## 2023-09-05 LAB — HEMOGLOBIN A1C
Est. average glucose Bld gHb Est-mCnc: 203 mg/dL
Hgb A1c MFr Bld: 8.7 % — ABNORMAL HIGH (ref 4.8–5.6)

## 2023-09-07 ENCOUNTER — Ambulatory Visit: Payer: Self-pay

## 2023-09-20 ENCOUNTER — Telehealth: Payer: Self-pay

## 2023-09-20 NOTE — Telephone Encounter (Signed)
 Copied from CRM [id number]. Topic: Clinical - Request for Lab/Test Order >> Sep 20, 2023  9:13 AM Montie POUR wrote: Reason for CRM:  Delon with [facility] needs a new order for Ms. Vicenta.  CT MAXILLOFACIAL W & WO CONTRAST needs to state with contrast only. Please do not put without contrast on order.

## 2023-09-21 ENCOUNTER — Ambulatory Visit (HOSPITAL_COMMUNITY): Admission: RE | Admit: 2023-09-21 | Source: Ambulatory Visit

## 2023-09-28 ENCOUNTER — Other Ambulatory Visit: Payer: Self-pay

## 2023-09-28 DIAGNOSIS — R590 Localized enlarged lymph nodes: Secondary | ICD-10-CM

## 2023-10-02 ENCOUNTER — Ambulatory Visit (HOSPITAL_COMMUNITY): Admission: RE | Admit: 2023-10-02 | Source: Ambulatory Visit

## 2023-10-16 ENCOUNTER — Telehealth: Payer: Self-pay

## 2023-10-16 NOTE — Telephone Encounter (Signed)
 Copied from CRM #[id number]. Topic: General - Other >> Oct 16, 2023 11:24 AM Emylou G wrote: Reason for CRM: Please call patient.. needs to reschedule but unsure which doctor is accepting.SABRA also had questions on her mychart.

## 2023-10-16 NOTE — Telephone Encounter (Signed)
Called rescheduled

## 2023-10-18 NOTE — Telephone Encounter (Signed)
 This has been reorderd by Leita

## 2023-11-29 ENCOUNTER — Ambulatory Visit: Admitting: Nurse Practitioner

## 2024-02-05 ENCOUNTER — Ambulatory Visit

## 2024-02-05 ENCOUNTER — Telehealth: Payer: Self-pay

## 2024-02-05 NOTE — Telephone Encounter (Signed)
 Copied from CRM [id number]. Topic: Appointments - Scheduling Inquiry for Clinic >> Feb 05, 2024 10:10 AM Jeoffrey H wrote: Reason for CRM: Patient was schd for an office visit 01/27 however appt was cancelled. She stated she has some serious medical issues/testing she wanted to address with pcp. Patient stated her last appt was cancelled also and she has been waiting a long time.The next available appt is not until March 2026. Please contact patient for an earlier time slot. She is requesting a Tuesday or Thursday at any time.    Lorrin- [phone]

## 2024-02-05 NOTE — Telephone Encounter (Signed)
 Is there anywhere we can put patient? Please advise. Thanks

## 2024-02-05 NOTE — Telephone Encounter (Signed)
 error

## 2024-04-14 ENCOUNTER — Ambulatory Visit: Payer: Self-pay
